# Patient Record
Sex: Female | Born: 1970 | Race: Asian | Hispanic: No | Marital: Married | Smoking: Never smoker
Health system: Southern US, Community
[De-identification: ages and names within clinical notes are randomized; demographics above are authoritative.]

---

## 1998-11-10 ENCOUNTER — Other Ambulatory Visit: Admission: RE | Admit: 1998-11-10 | Discharge: 1998-11-10 | Payer: Self-pay | Admitting: Obstetrics & Gynecology

## 1999-03-21 ENCOUNTER — Ambulatory Visit (HOSPITAL_COMMUNITY): Admission: RE | Admit: 1999-03-21 | Discharge: 1999-03-21 | Payer: Self-pay | Admitting: Obstetrics & Gynecology

## 1999-03-21 ENCOUNTER — Encounter: Payer: Self-pay | Admitting: Obstetrics & Gynecology

## 1999-08-06 HISTORY — PX: BREAST SURGERY: SHX581

## 1999-08-06 HISTORY — PX: NOSE SURGERY: SHX723

## 2011-03-18 ENCOUNTER — Ambulatory Visit: Payer: Self-pay

## 2012-10-12 ENCOUNTER — Ambulatory Visit: Payer: Self-pay | Admitting: Emergency Medicine

## 2012-10-12 LAB — CBC WITH DIFFERENTIAL/PLATELET
Basophil #: 0 10*3/uL (ref 0.0–0.1)
Basophil %: 0.8 %
Eosinophil #: 0 10*3/uL (ref 0.0–0.7)
Eosinophil %: 1.3 %
HCT: 38.7 % (ref 35.0–47.0)
HGB: 13 g/dL (ref 12.0–16.0)
Lymphocyte #: 1.6 10*3/uL (ref 1.0–3.6)
Lymphocyte %: 44.2 %
MCH: 30.3 pg (ref 26.0–34.0)
MCHC: 33.5 g/dL (ref 32.0–36.0)
MCV: 91 fL (ref 80–100)
Monocyte #: 0.2 x10 3/mm (ref 0.2–0.9)
Monocyte %: 5 %
Neutrophil #: 1.8 10*3/uL (ref 1.4–6.5)
Neutrophil %: 48.7 %
Platelet: 260 10*3/uL (ref 150–440)
RBC: 4.28 10*6/uL (ref 3.80–5.20)
RDW: 13 % (ref 11.5–14.5)
WBC: 3.7 10*3/uL (ref 3.6–11.0)

## 2012-10-12 LAB — URINALYSIS, COMPLETE
Glucose,UR: NEGATIVE mg/dL (ref 0–75)
Ketone: NEGATIVE
Nitrite: NEGATIVE
Protein: NEGATIVE

## 2012-10-12 LAB — COMPREHENSIVE METABOLIC PANEL
Albumin: 4 g/dL (ref 3.4–5.0)
Alkaline Phosphatase: 74 U/L (ref 50–136)
Anion Gap: 8 (ref 7–16)
BUN: 14 mg/dL (ref 7–18)
Bilirubin,Total: 0.4 mg/dL (ref 0.2–1.0)
Calcium, Total: 8.7 mg/dL (ref 8.5–10.1)
Chloride: 104 mmol/L (ref 98–107)
Co2: 30 mmol/L (ref 21–32)
Creatinine: 0.67 mg/dL (ref 0.60–1.30)
EGFR (African American): >60
EGFR (Non-African Amer.): >60
Glucose: 99 mg/dL (ref 65–99)
Osmolality: 284 (ref 275–301)
Potassium: 3.9 mmol/L (ref 3.5–5.1)
SGOT(AST): 19 U/L (ref 15–37)
SGPT (ALT): 28 U/L (ref 12–78)
Sodium: 142 mmol/L (ref 136–145)
Total Protein: 7.2 g/dL (ref 6.4–8.2)

## 2012-10-12 LAB — SEDIMENTATION RATE: Erythrocyte Sed Rate: 6 mm/hr (ref 0–20)

## 2012-10-12 LAB — MONONUCLEOSIS SCREEN: Mono Test: NEGATIVE

## 2012-11-30 ENCOUNTER — Ambulatory Visit: Payer: Self-pay | Admitting: Family Medicine

## 2012-12-03 ENCOUNTER — Ambulatory Visit: Payer: Self-pay | Admitting: Family Medicine

## 2013-03-24 ENCOUNTER — Ambulatory Visit: Payer: Self-pay | Admitting: Family Medicine

## 2013-06-10 ENCOUNTER — Encounter: Payer: Self-pay | Admitting: Adult Health

## 2013-06-10 ENCOUNTER — Ambulatory Visit (INDEPENDENT_AMBULATORY_CARE_PROVIDER_SITE_OTHER): Payer: BC Managed Care – PPO | Admitting: Adult Health

## 2013-06-10 VITALS — BP 98/66 | HR 66 | Temp 98.1°F | Resp 12 | Ht 63.0 in | Wt 106.0 lb

## 2013-06-10 DIAGNOSIS — Z1159 Encounter for screening for other viral diseases: Secondary | ICD-10-CM

## 2013-06-10 DIAGNOSIS — K649 Unspecified hemorrhoids: Secondary | ICD-10-CM

## 2013-06-10 DIAGNOSIS — Z139 Encounter for screening, unspecified: Secondary | ICD-10-CM | POA: Insufficient documentation

## 2013-06-10 MED ORDER — HYDROCORTISONE 2.5 % RE CREA: 1.0000 "application " | TOPICAL_CREAM | Freq: Two times a day (BID) | RECTAL | Status: DC

## 2013-06-10 NOTE — Assessment & Plan Note (Signed)
Father recently diagnosed with hepatitis B. Check hepatitis panel

## 2013-06-10 NOTE — Assessment & Plan Note (Signed)
Small protruding hemorrhoid. Anusol cream bid x 7 days.

## 2013-06-10 NOTE — [place] Notes (Signed)
Subjective:    Patient ID: Bianca Stone, female    DOB: 01-Apr-1971, 42 y.o.   MRN: [id number]  HPI  Patient is a pleasant 42 y/o female who presents to establish care. She was followed previously by Dr. Elizabeth Sauer in [place]. She reports that her father was recently diagnosed with hepatitis B. She would like to be tested for this. She also reports rectal itching and would like this evaluated as well. Otherwise, she has no concerns this visit.   History reviewed. No pertinent past medical history.   History reviewed. No pertinent past surgical history.   Family History  Problem Relation Age of Onset  . Hyperlipidemia Mother   . Hepatitis B Father      History   Social History  . Marital Status: Married    Spouse Name: N/A    Number of Children: 0  . Years of Education: N/A   Occupational History  . [employer] in [place]   Social History Main Topics  . Smoking status: Never Smoker   . Smokeless tobacco: Never Used  . Alcohol Use: No  . Drug Use: No  . Sexual Activity: Not on file   Other Topics Concern  . Not on file   Social History Narrative   Bianca Stone is from [place]. She came to the [place] in 1994. She lives at home with her husband. They do not have any children. She works at a [employer] in [employer]. She enjoys shopping, traveling and watching TV.       Review of Systems  Constitutional: Negative.   HENT: Negative.   Eyes: Negative.   Respiratory: Negative.   Cardiovascular: Negative.   Gastrointestinal: Negative.  Negative for constipation.       Hemorrhoid  Endocrine: Negative.   Genitourinary: Negative.   Musculoskeletal: Negative.   Skin: Negative.   Allergic/Immunologic: Negative.   Neurological: Negative.   Hematological: Negative.   Psychiatric/Behavioral: Negative.        Objective:   Physical Exam  Constitutional: She is oriented to person, place, and time. She appears well-developed and well-nourished. No distress.   HENT:  Head: Normocephalic and atraumatic.  Right Ear: External ear normal.  Left Ear: External ear normal.  Nose: Nose normal.  Mouth/Throat: Oropharynx is clear and moist.  Eyes: Conjunctivae and EOM are normal. Pupils are equal, round, and reactive to light.  Neck: Normal range of motion. Neck supple. No tracheal deviation present. No thyromegaly present.  Cardiovascular: Normal rate, regular rhythm, normal heart sounds and intact distal pulses.  Exam reveals no gallop and no friction rub.   No murmur heard. Pulmonary/Chest: Effort normal and breath sounds normal. No respiratory distress. She has no wheezes. She has no rales.  Abdominal: Soft. Bowel sounds are normal. She exhibits no distension and no mass. There is no tenderness. There is no rebound and no guarding.  Small protruding hemorrhoid   Musculoskeletal: Normal range of motion. She exhibits no edema and no tenderness.  Lymphadenopathy:    She has no cervical adenopathy.  Neurological: She is alert and oriented to person, place, and time. She has normal reflexes. No cranial nerve deficit. Coordination normal.  Skin: Skin is warm and dry.  Psychiatric: She has a normal mood and affect. Her behavior is normal. Judgment and thought content normal.   BP 98/66  Pulse 66  Temp(Src) 98.1 F (36.7 C) (Oral)  Resp 12  Ht 5\' 3"  (1.6 m)  Wt 106 lb (  48.081 kg)  BMI 18.78 kg/m2  SpO2 98%        Assessment & Plan:

## 2013-06-11 ENCOUNTER — Encounter: Payer: Self-pay | Admitting: *Deleted

## 2013-06-11 LAB — HEPATIC FUNCTION PANEL
ALT: 17 U/L (ref 0–35)
AST: 21 U/L (ref 0–37)
Albumin: 4.5 g/dL (ref 3.5–5.2)
Alkaline Phosphatase: 55 U/L (ref 39–117)
Bilirubin, Direct: 0.1 mg/dL (ref 0.0–0.3)
Total Bilirubin: 0.6 mg/dL (ref 0.3–1.2)
Total Protein: 7.5 g/dL (ref 6.0–8.3)

## 2013-06-11 LAB — HEPATITIS B CORE ANTIBODY, TOTAL: Hep B Core Total Ab: NONREACTIVE

## 2013-06-11 LAB — HEPATITIS C ANTIBODY: HCV Ab: NEGATIVE

## 2013-06-11 LAB — HEPATITIS B SURFACE ANTIBODY,QUALITATIVE: Hep B S Ab: NEGATIVE

## 2013-06-11 LAB — HEPATITIS B SURFACE ANTIGEN: Hepatitis B Surface Ag: NEGATIVE

## 2013-06-17 ENCOUNTER — Other Ambulatory Visit: Payer: Self-pay | Admitting: *Deleted

## 2013-06-17 ENCOUNTER — Ambulatory Visit (INDEPENDENT_AMBULATORY_CARE_PROVIDER_SITE_OTHER): Payer: BC Managed Care – PPO | Admitting: Adult Health

## 2013-06-17 ENCOUNTER — Encounter: Payer: Self-pay | Admitting: Adult Health

## 2013-06-17 ENCOUNTER — Ambulatory Visit: Payer: BC Managed Care – PPO | Admitting: Adult Health

## 2013-06-17 VITALS — BP 118/80 | HR 74 | Resp 14 | Wt 108.0 lb

## 2013-06-17 DIAGNOSIS — R1012 Left upper quadrant pain: Secondary | ICD-10-CM

## 2013-06-17 LAB — CBC WITH DIFFERENTIAL/PLATELET
Basophils Absolute: 0 10*3/uL (ref 0.0–0.1)
Basophils Relative: 0.6 % (ref 0.0–3.0)
Eosinophils Absolute: 0 10*3/uL (ref 0.0–0.7)
Eosinophils Relative: 0.8 % (ref 0.0–5.0)
HCT: 37.4 % (ref 36.0–46.0)
Hemoglobin: 12.7 g/dL (ref 12.0–15.0)
Lymphocytes Relative: 37.6 % (ref 12.0–46.0)
Lymphs Abs: 1.7 10*3/uL (ref 0.7–4.0)
MCHC: 34.1 g/dL (ref 30.0–36.0)
MCV: 90.5 fl (ref 78.0–100.0)
Monocytes Absolute: 0.2 10*3/uL (ref 0.1–1.0)
Monocytes Relative: 5.2 % (ref 3.0–12.0)
Neutro Abs: 2.5 10*3/uL (ref 1.4–7.7)
Neutrophils Relative %: 55.8 % (ref 43.0–77.0)
Platelets: 274 10*3/uL (ref 150.0–400.0)
RBC: 4.13 Mil/uL (ref 3.87–5.11)
RDW: 13.1 % (ref 11.5–14.6)
WBC: 4.6 10*3/uL (ref 4.5–10.5)

## 2013-06-17 LAB — COMPREHENSIVE METABOLIC PANEL
ALT: 18 U/L (ref 0–35)
AST: 20 U/L (ref 0–37)
Albumin: 3.9 g/dL (ref 3.5–5.2)
Alkaline Phosphatase: 53 U/L (ref 39–117)
BUN: 15 mg/dL (ref 6–23)
CO2: 29 mEq/L (ref 19–32)
Calcium: 8.6 mg/dL (ref 8.4–10.5)
Chloride: 106 mEq/L (ref 96–112)
Creatinine, Ser: 0.5 mg/dL (ref 0.4–1.2)
GFR: 149.04 mL/min (ref >60.00)
Glucose, Bld: 80 mg/dL (ref 70–99)
Potassium: 4.4 mEq/L (ref 3.5–5.1)
Sodium: 139 mEq/L (ref 135–145)
Total Bilirubin: 0.6 mg/dL (ref 0.3–1.2)
Total Protein: 6.8 g/dL (ref 6.0–8.3)

## 2013-06-17 NOTE — Telephone Encounter (Deleted)
Refill Request  Premarin 0.3 mg tab  #30  Take one by mouth every day

## 2013-06-17 NOTE — [place] Notes (Signed)
  Subjective:    Patient ID: Bianca Stone, female    DOB: [birth year], 42 y.o.   MRN: [id number]  HPI Patient is a pleasant 42 year old female who presents to clinic with complaints of left upper quadrant abdominal pain. She is having difficulty expressing herself secondary to language barrier. The patient is [place] She is trying to explain that she has had a surgery in the past. She reports surgery approximately 20 years ago. She also is able to explain that she cannot have children. She cannot specify what type of surgery she had. She reports that she had surgery on her ovaries and they have been removed, but later states that she has her ovaries. Patient is new to clinic. She reports this abdominal pain has been ongoing intermittently for approximately one year. Her previous PCP sent her for an ultrasound which she reports was normal. Records received from previous PCP does not specify surgical history. Patient is afebrile. She is able to state that her pain is improved when she eats. She denies constipation. She denies blood in her stool.   Current Outpatient Prescriptions on File Prior to Visit  Medication Sig Dispense Refill  . hydrocortisone (ANUSOL-HC) 2.5 % rectal cream Place 1 application rectally 2 (two) times daily. Apply twice daily to rectum for 7 days.  30 g  0   No current facility-administered medications on file prior to visit.    Review of Systems  Constitutional: Negative.   Gastrointestinal: Positive for abdominal pain.       LUQ and also somewhat on the lower left side  Psychiatric/Behavioral: Negative.        Objective:   Physical Exam  Constitutional: She appears well-developed and well-nourished. No distress.  Abdominal: Soft. Bowel sounds are normal. She exhibits no distension and no mass. There is tenderness. There is no rebound and no guarding.  Tenderness with palpation of left upper quadrant and down the left side of the abdomen.          Assessment &  Plan:

## 2013-06-17 NOTE — Assessment & Plan Note (Signed)
Patient is a very pleasant female with LUQ abdominal pain and also down the left side of abdomen. I am sending her for a CT of abdomen. She reports having normal ultrasound. Language barrier. She is not able to explain what type of surgery she had approximately 20 years ago.? Oophorectomy ? Hysterectomy.

## 2013-06-22 ENCOUNTER — Ambulatory Visit: Payer: Self-pay | Admitting: Adult Health

## 2013-07-08 ENCOUNTER — Encounter: Payer: Self-pay | Admitting: *Deleted

## 2013-07-08 ENCOUNTER — Other Ambulatory Visit: Payer: Self-pay | Admitting: *Deleted

## 2013-07-08 DIAGNOSIS — R1012 Left upper quadrant pain: Secondary | ICD-10-CM

## 2013-07-21 ENCOUNTER — Encounter: Payer: Self-pay | Admitting: Adult Health

## 2013-09-08 ENCOUNTER — Other Ambulatory Visit (INDEPENDENT_AMBULATORY_CARE_PROVIDER_SITE_OTHER): Payer: BC Managed Care – PPO

## 2013-09-08 DIAGNOSIS — R1012 Left upper quadrant pain: Secondary | ICD-10-CM

## 2013-09-08 LAB — URINALYSIS, ROUTINE W REFLEX MICROSCOPIC
BILIRUBIN URINE: NEGATIVE
Ketones, ur: NEGATIVE
Leukocytes, UA: NEGATIVE
Nitrite: NEGATIVE
PH: 6 (ref 5.0–8.0)
SPECIFIC GRAVITY, URINE: 1.025 (ref 1.000–1.030)
TOTAL PROTEIN, URINE-UPE24: NEGATIVE
Urine Glucose: NEGATIVE
Urobilinogen, UA: 0.2 (ref 0.0–1.0)

## 2013-09-09 ENCOUNTER — Encounter: Payer: Self-pay | Admitting: *Deleted

## 2014-07-08 ENCOUNTER — Ambulatory Visit: Payer: Self-pay | Admitting: Family Medicine

## 2014-07-08 LAB — CBC WITH DIFFERENTIAL/PLATELET
Basophil #: 0 10*3/uL (ref 0.0–0.1)
Basophil %: 0.4 %
Eosinophil #: 0 10*3/uL (ref 0.0–0.7)
Eosinophil %: 1.1 %
HCT: 40 % (ref 35.0–47.0)
HGB: 13.1 g/dL (ref 12.0–16.0)
LYMPHS PCT: 29.5 %
Lymphocyte #: 1.3 10*3/uL (ref 1.0–3.6)
MCH: 30.3 pg (ref 26.0–34.0)
MCHC: 32.8 g/dL (ref 32.0–36.0)
MCV: 92 fL (ref 80–100)
MONO ABS: 0.2 x10 3/mm (ref 0.2–0.9)
MONOS PCT: 4.1 %
NEUTROS PCT: 64.9 %
Neutrophil #: 2.9 10*3/uL (ref 1.4–6.5)
Platelet: 257 10*3/uL (ref 150–440)
RBC: 4.33 10*6/uL (ref 3.80–5.20)
RDW: 13.1 % (ref 11.5–14.5)
WBC: 4.4 10*3/uL (ref 3.6–11.0)

## 2014-07-20 IMAGING — CT CT ABD-PELV W/ CM
2 of 4 series · 17 of 46 positions shown, 19 images · IV contrast (isovue)
Comparison: Abdominal ultrasound November 30, 2012

ADDENDUM:
Comment: Uterus and ovaries appear normal in size and contour by CT.
If the patient has symptoms referable to these organs, correlation
with pelvic ultrasound may be reasonable and advisable to further
assess.
CLINICAL DATA: Abdominal pain

EXAM:
CT ABDOMEN AND PELVIS WITH CONTRAST
TECHNIQUE: Multidetector CT imaging of the abdomen and pelvis was performed
using the standard protocol following bolus administration of
intravenous contrast. Oral contrast was also administered.
CONTRAST:  100 mL Isovue 370 nonionic

[Series 2: axial soft tissue · axial · 0.60mm/px · z∈[-738,-378]mm · 14 of 80 slices shown, 16 images]
[im 4/80  soft-tissue]
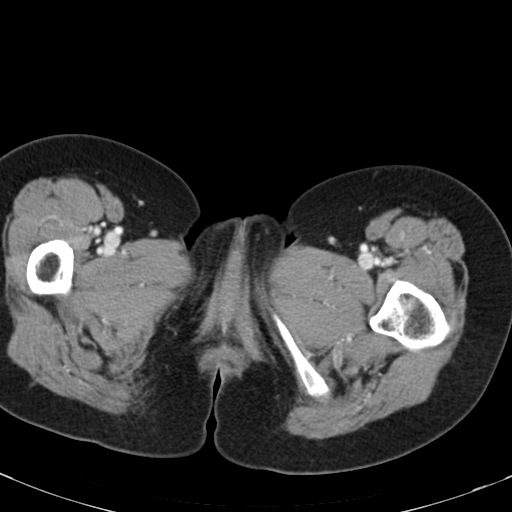
[im 4/80  bone]
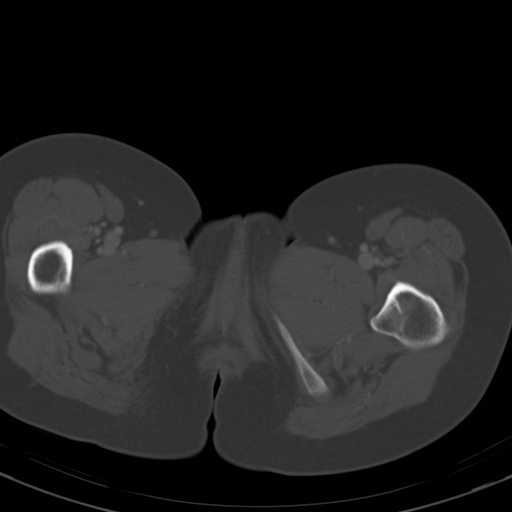
[im 10/80  soft-tissue]
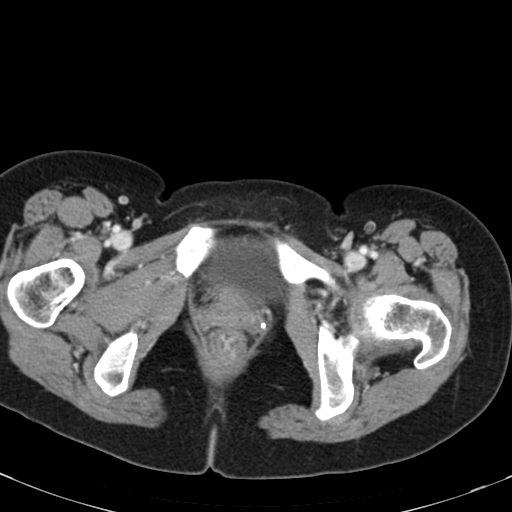
[im 17/80  soft-tissue]
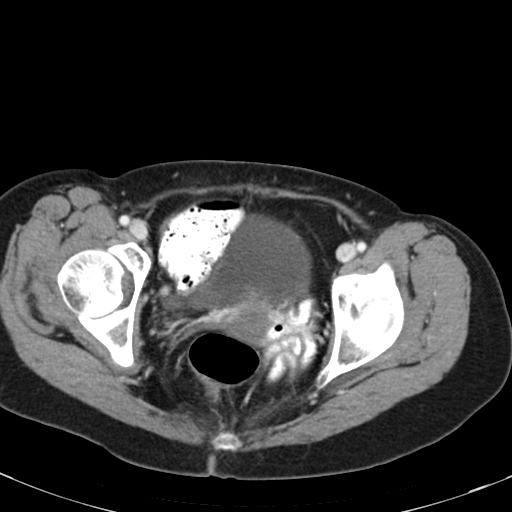
[im 20/80  soft-tissue]
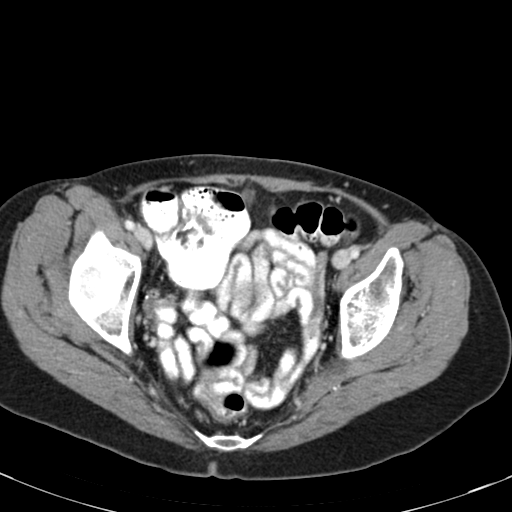
[im 27/80  soft-tissue]
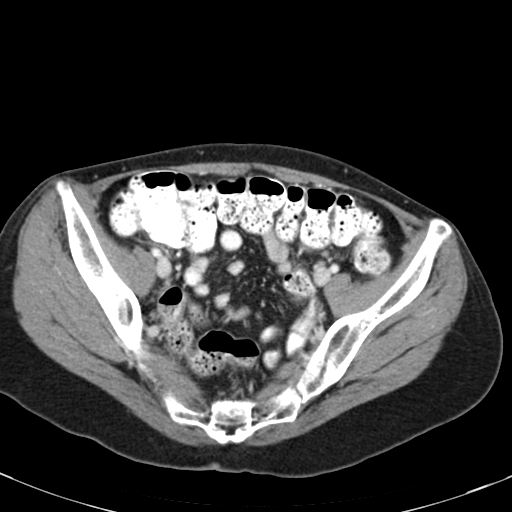
[im 33/80  soft-tissue]
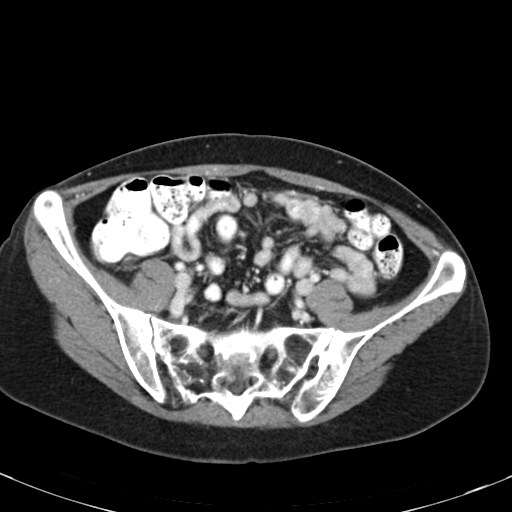
[im 37/80  soft-tissue]
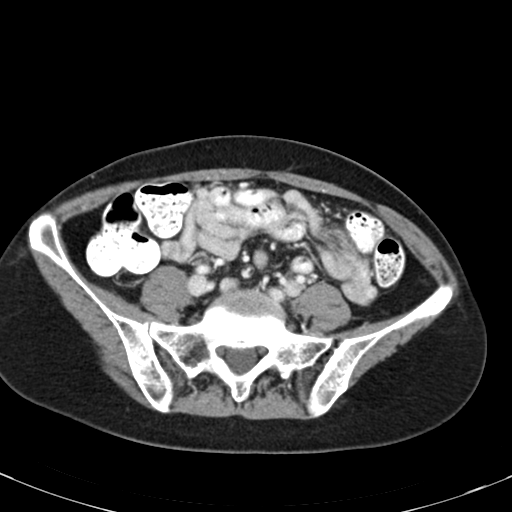
[im 43/80  soft-tissue]
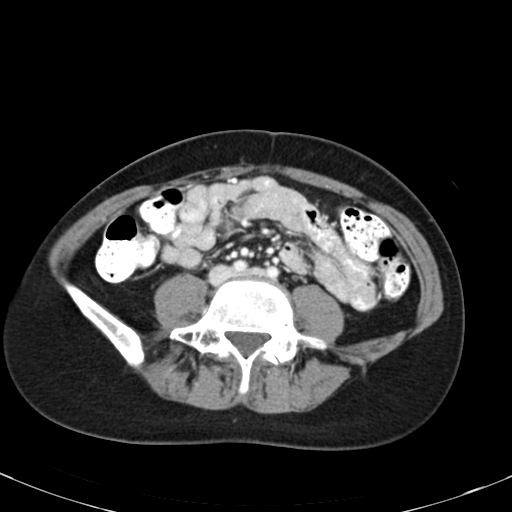
[im 47/80  soft-tissue]
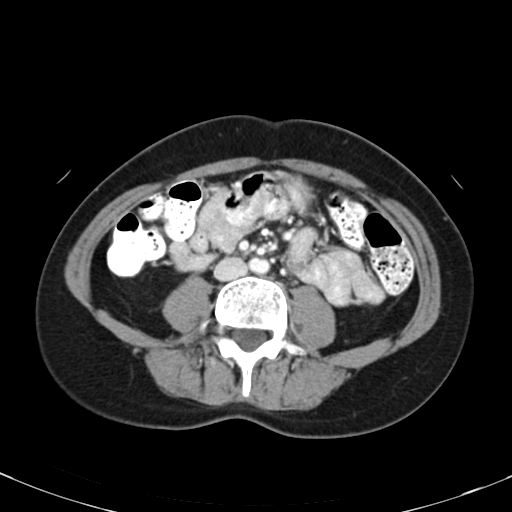
[im 47/80  bone]
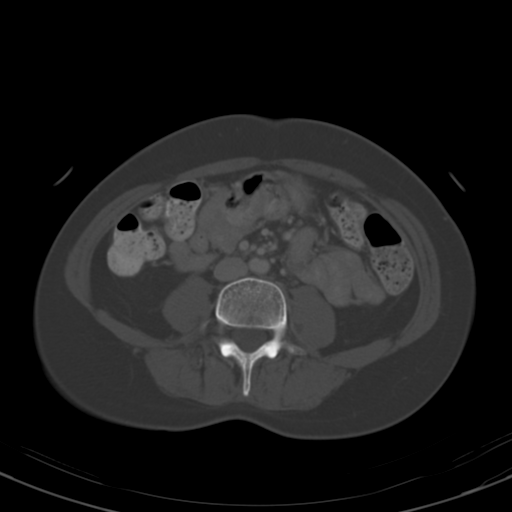
[im 53/80  soft-tissue]
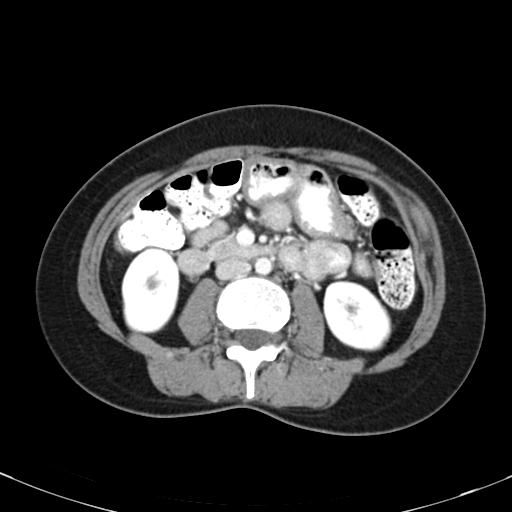
[im 60/80  soft-tissue]
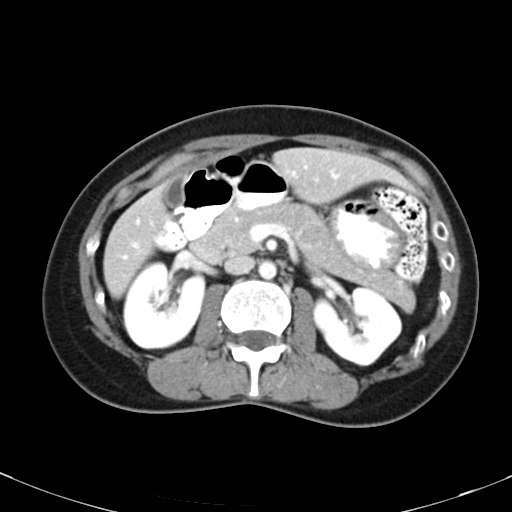
[im 63/80  soft-tissue]
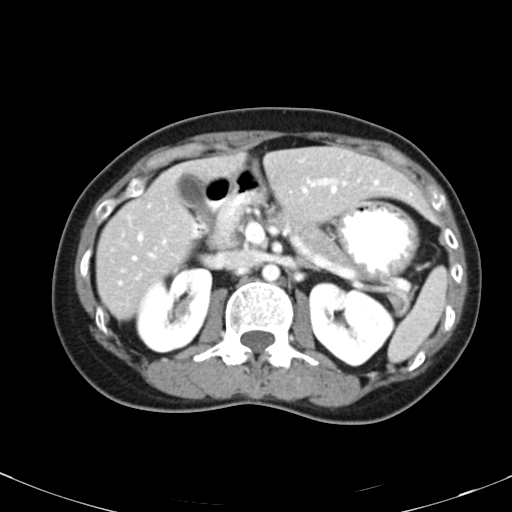
[im 70/80  soft-tissue]
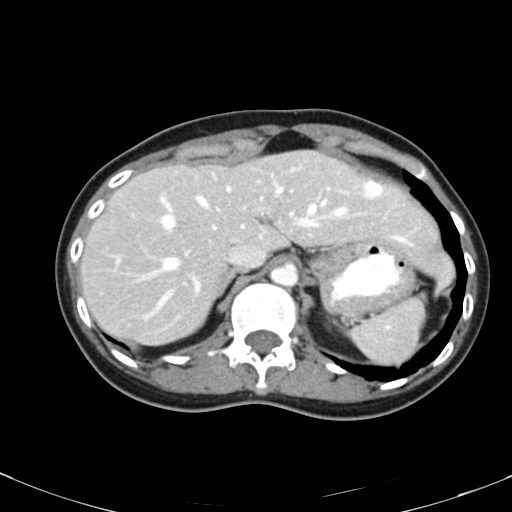
[im 76/80  soft-tissue]
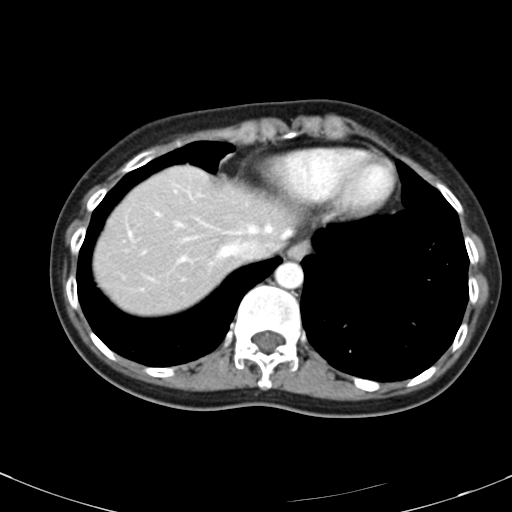

[Series 602: coronals · coronal · 0.78mm/px · 3 of 120 slices shown]
[im 40/120  soft-tissue]
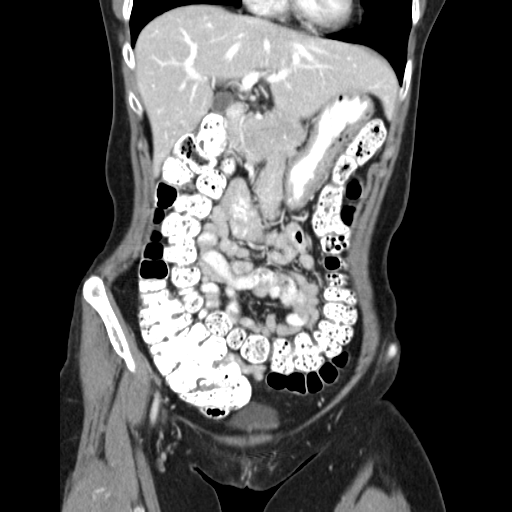
[im 53/120  soft-tissue]
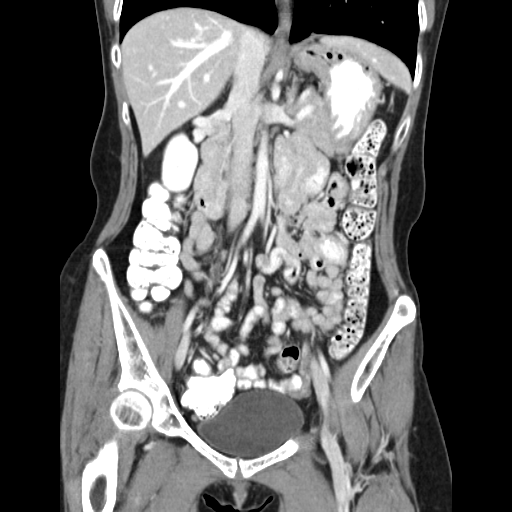
[im 67/120  soft-tissue]
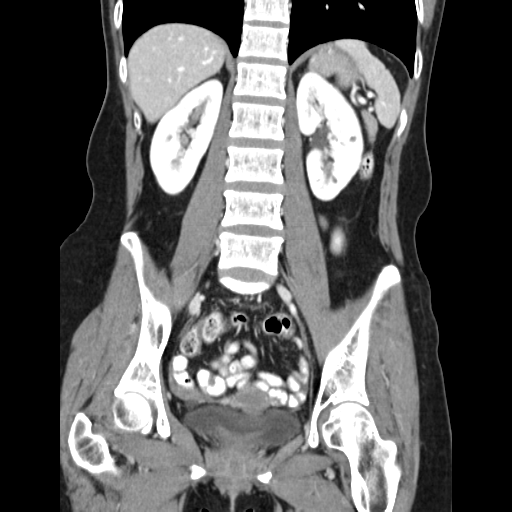

[17 of 46 positions shown; findings below may reference images not displayed]

FINDINGS: Lung bases are clear.  Breast implants are noted bilaterally.

No focal liver lesions are identified. There is no biliary duct
dilatation.

Spleen, pancreas, and adrenals appear normal.

There is a 9 x 9 mm simple cyst in the upper to mid left kidney.
There are subcentimeter simple cysts in the medial mid left kidney.
There are subcentimeter cyst in the mid to lower pole region on the
left as well. No noncystic masses are identified in either kidney.
There is no hydronephrosis or calculus in either kidney. There is no
demonstrable ureteral calculus on either side.

In the pelvis, the urinary bladder is midline with normal wall
thickness. There is no pelvic mass or fluid collection. The appendix
appears normal.

There is no bowel obstruction. There is no free air or portal venous
air. The gastric wall appears moderately thickened diffusely.

There is no apparent ascites, adenopathy, or abscess in the abdomen
or pelvis. Aorta is nonaneurysmal. There are no blastic or lytic
bone lesions.
IMPRESSION: Wall thickening in the stomach; suspect a degree of gastritis. Bowel
otherwise appears unremarkable on this study. No bowel obstruction.
No abscess. Appendix appears normal.

There are cysts in the left kidney. There is no hydronephrosis on
either side.

## 2014-09-13 ENCOUNTER — Ambulatory Visit: Payer: Self-pay | Admitting: Family Medicine

## 2015-08-06 HISTORY — PX: AUGMENTATION MAMMAPLASTY: SUR837

## 2016-04-04 ENCOUNTER — Other Ambulatory Visit: Payer: Self-pay | Admitting: Nurse Practitioner

## 2016-04-04 DIAGNOSIS — Z01419 Encounter for gynecological examination (general) (routine) without abnormal findings: Secondary | ICD-10-CM

## 2016-04-17 ENCOUNTER — Ambulatory Visit
Admission: RE | Admit: 2016-04-17 | Discharge: 2016-04-17 | Disposition: A | Discharge status: Home / Self Care | Payer: BLUE CROSS/BLUE SHIELD | Source: Ambulatory Visit | Attending: Nurse Practitioner | Admitting: Nurse Practitioner

## 2016-04-17 ENCOUNTER — Encounter: Payer: Self-pay | Admitting: Radiology

## 2016-04-17 DIAGNOSIS — Z01419 Encounter for gynecological examination (general) (routine) without abnormal findings: Secondary | ICD-10-CM

## 2016-04-17 DIAGNOSIS — Z1231 Encounter for screening mammogram for malignant neoplasm of breast: Secondary | ICD-10-CM | POA: Insufficient documentation

## 2017-04-15 ENCOUNTER — Other Ambulatory Visit: Payer: Self-pay | Admitting: Nurse Practitioner

## 2017-04-15 DIAGNOSIS — Z1231 Encounter for screening mammogram for malignant neoplasm of breast: Secondary | ICD-10-CM

## 2017-04-30 ENCOUNTER — Ambulatory Visit
Admission: RE | Admit: 2017-04-30 | Discharge: 2017-04-30 | Disposition: A | Discharge status: Home / Self Care | Payer: BLUE CROSS/BLUE SHIELD | Source: Ambulatory Visit | Attending: Nurse Practitioner | Admitting: Nurse Practitioner

## 2017-04-30 DIAGNOSIS — Z1231 Encounter for screening mammogram for malignant neoplasm of breast: Secondary | ICD-10-CM | POA: Insufficient documentation

## 2017-04-30 DIAGNOSIS — Z9882 Breast implant status: Secondary | ICD-10-CM | POA: Diagnosis not present

## 2018-02-23 ENCOUNTER — Encounter: Payer: Self-pay | Admitting: Emergency Medicine

## 2018-02-23 ENCOUNTER — Ambulatory Visit (INDEPENDENT_AMBULATORY_CARE_PROVIDER_SITE_OTHER): Payer: BLUE CROSS/BLUE SHIELD

## 2018-02-23 ENCOUNTER — Ambulatory Visit
Admission: EM | Admit: 2018-02-23 | Discharge: 2018-02-23 | Disposition: A | Discharge status: Home / Self Care | Payer: BLUE CROSS/BLUE SHIELD | Attending: Family Medicine | Admitting: Family Medicine

## 2018-02-23 ENCOUNTER — Other Ambulatory Visit: Payer: Self-pay

## 2018-02-23 DIAGNOSIS — S9031XA Contusion of right foot, initial encounter: Secondary | ICD-10-CM

## 2018-02-23 DIAGNOSIS — W208XXA Other cause of strike by thrown, projected or falling object, initial encounter: Secondary | ICD-10-CM

## 2018-02-23 DIAGNOSIS — S91311A Laceration without foreign body, right foot, initial encounter: Secondary | ICD-10-CM

## 2018-02-23 DIAGNOSIS — R2241 Localized swelling, mass and lump, right lower limb: Secondary | ICD-10-CM

## 2018-02-23 DIAGNOSIS — M79671 Pain in right foot: Secondary | ICD-10-CM

## 2018-02-23 DIAGNOSIS — Z23 Encounter for immunization: Secondary | ICD-10-CM

## 2018-02-23 MED ORDER — LIDOCAINE-EPINEPHRINE-TETRACAINE (LET) SOLUTION
3.0000 mL | Freq: Once | NASAL | Status: AC
  Administered 2018-02-23: 16:00:00 3 mL via TOPICAL

## 2018-02-23 MED ORDER — TETANUS-DIPHTH-ACELL PERTUSSIS 5-2.5-18.5 LF-MCG/0.5 IM SUSP
0.5000 mL | Freq: Once | INTRAMUSCULAR | Status: AC
  Administered 2018-02-23: 0.5 mL via INTRAMUSCULAR

## 2018-02-23 MED ORDER — MUPIROCIN 2 % EX OINT: TOPICAL_OINTMENT | CUTANEOUS | 0 refills | Status: AC

## 2018-02-23 MED ORDER — IBUPROFEN 600 MG PO TABS
600.0000 mg | ORAL_TABLET | Freq: Once | ORAL | Status: AC
  Administered 2018-02-23: 600 mg via ORAL

## 2018-02-23 NOTE — ED Triage Notes (Signed)
Patient dropped a mirror on the top of her right foot about 30 min ago. Laceration to top of right foot.

## 2018-02-23 NOTE — Discharge Instructions (Signed)
Rest. Ice. Keep clean. Use postop shoe and gradually increased activity as tolerated.   Follow up with your primary care physician this week as needed. Return to Urgent care for new or worsening concerns.

## 2018-02-23 NOTE — ED Provider Notes (Signed)
[facility] ____________________________________________  Time seen: Approximately 5:13 PM  I have reviewed the triage vital signs and the nursing notes.   HISTORY  Chief Complaint Laceration   HPI Bianca Stone is a 47 y.o. female presented for evaluation of right foot pain after injury that occurred just prior to arrival.  Patient states that she is currently moving and went back to her old house today to finish cleaning up some items, and removing a small desk a mirror and said the door fell out.  States that the mirror landed on her right top of her foot causing pain and injury.  Denies other pain or injury.  Denies head injury loss conscious.  Has continue to remain ambulatory.  Denies pain radiation, paresthesias or other complaints.  Unsure of last tetanus immunization.  States pain is mild currently, worse with direct palpation.  Denies other complaints. Denies recent sickness. Denies recent antibiotic use.   Rey, Richarda Overlieaquel M, NP: PCP No LMP recorded. Patient is postmenopausal.   History reviewed. No pertinent past medical history.  Patient Active Problem List   Diagnosis Date Noted  . Abdominal pain, left upper quadrant 06/17/2013  . Screening 06/10/2013  . Hemorrhoid 06/10/2013    Past Surgical History:  Procedure Laterality Date  . AUGMENTATION MAMMAPLASTY Bilateral 08/2015   implants replaced 9 mos ago,1st set 15 yrs ago  . BREAST SURGERY  2001   Augmentation  . NOSE SURGERY  2001   implant     No current facility-administered medications for this encounter.   Current Outpatient Medications:  .  mupirocin ointment (BACTROBAN) 2 %, Apply two times a day for 7 days., Disp: 22 g, Rfl: 0  Allergies Patient has no known allergies.  Family History  Problem Relation Age of Onset  . Hyperlipidemia Mother   . Hepatitis B Father   . Breast cancer Neg Hx     Social History Social History   Tobacco Use  . Smoking status: Never Smoker  .  Smokeless tobacco: Never Used  Substance Use Topics  . Alcohol use: No  . Drug use: No    Review of Systems Constitutional: No fever/chills Cardiovascular: Denies chest pain. Respiratory: Denies shortness of breath. Musculoskeletal: Negative for back pain. Skin: as above.  ____________________________________________   PHYSICAL EXAM:  VITAL SIGNS: ED Triage Vitals  Enc Vitals Group     BP 02/23/18 1525 108/85     Pulse Rate 02/23/18 1525 76     Resp 02/23/18 1525 18     Temp 02/23/18 1525 98.1 F (36.7 C)     Temp Source 02/23/18 1525 Oral     SpO2 02/23/18 1525 99 %     Weight 02/23/18 1523 108 lb (49 kg)     Height 02/23/18 1523 5\' 2"  (1.575 m)     Head Circumference --      Peak Flow --      Pain Score 02/23/18 1710 0     Pain Loc --      Pain Edu? --      Excl. in GC? --     Constitutional: Alert and oriented. Well appearing and in no acute distress. ENT      Head: Normocephalic and atraumatic. Cardiovascular: Normal rate, regular rhythm. Grossly normal heart sounds.  Good peripheral circulation. Respiratory: Normal respiratory effort without tachypnea nor retractions. Breath sounds are clear and equal bilaterally. No wheezes, rales, rhonchi. Musculoskeletal: Bilateral pedal pulses equal and easily palpated.Antalgic gait Neurologic:  Normal speech  and language. No gross focal neurologic deficits are appreciated. Speech is normal.  Skin:  Skin is warm, dry. Except: Right dorsal medial to midfoot area of ecchymosis and mild edema, with approximately 1.5cm laceration, appears superficial, skin well approximated, moderate tenderness to direct palpation, no drainage, no bleeding, no appearance of foreign bodies, normal distal sensation, right foot otherwise nontender.  Psychiatric: Mood and affect are normal. Speech and behavior are normal. Patient exhibits appropriate insight and judgment   ___________________________________________   LABS (all labs ordered are  listed, but only abnormal results are displayed)  Labs Reviewed - No data to display  RADIOLOGY  Dg Foot Complete Right  Result Date: 02/23/2018 CLINICAL DATA:  Pain and laceration. Mirror fell on foot. Initial encounter. EXAM: RIGHT FOOT COMPLETE - 3+ VIEW COMPARISON:  None. FINDINGS: Dorsal forefoot soft tissue swelling without fracture or opaque foreign body. IMPRESSION: Soft tissue swelling without fracture or opaque foreign body. Electronically Signed   By: Marnee Spring M.D.   On: 02/23/2018 15:40   ____________________________________________  PROCEDURES Procedures   Procedure(s) performed:  Procedure explained and verbal consent obtained. Consent: Verbal consent obtained. Written consent not obtained. Risks and benefits: risks, benefits and alternatives were discussed Patient identity confirmed: verbally with patient and hospital-assigned identification number  Consent given by: patient   Laceration Repair Location: right foot Length: 1.5 cm Foreign bodies: no foreign bodies Tendon involvement: none Nerve involvement: none Preparation: Patient was prepped and draped in the usual sterile fashion. Anesthesia with topical LET Cleaned with betadine Irrigation solution: saline Irrigation method: jet lavage Amount of cleaning: copious Repaired with x 2 steristrips and dermabond to ends of steristrips Approximation: loose Patient tolerate well. Wound well approximated post repair.  Antibiotic ointment and dressing applied.  Wound care instructions provided.  Observe for any signs of infection or other problems.      INITIAL IMPRESSION / ASSESSMENT AND PLAN / ED COURSE  Pertinent labs & imaging results that were available during my care of the patient were reviewed by me and considered in my medical decision making (see chart for details).  Well appearing patient. No acute distress. Presents for right foot pain post mechanical injury that occurred prior to arrival.  Right foot xray as above per radiologist, soft tissue swelling without fracture or radiopaque foreign body. Wound cleaned and repaired as above. Tetanus immunization updated. 600 mg single dose ibuprofen in urgent care. Will rx bactroban. Encouraged keeping clean, ice, supportive care and monitoring. Post op shoe given. Steady gait. Discussed indication, risks and benefits of medications with patient.  Discussed follow up with Primary care physician this week. Discussed follow up and return parameters including no resolution or any worsening concerns. Patient verbalized understanding and agreed to plan.   ____________________________________________   FINAL CLINICAL IMPRESSION(S) / ED DIAGNOSES  Final diagnoses:  Contusion of right foot, initial encounter  Laceration of right foot, initial encounter     ED Discharge Orders        Ordered    mupirocin ointment (BACTROBAN) 2 %     02/23/18 1702       Note: This dictation was prepared with Dragon dictation along with smaller phrase technology. Any transcriptional errors that result from this process are unintentional.         Renford Dills, NP 02/23/18 1801

## 2018-03-10 ENCOUNTER — Ambulatory Visit
Admission: EM | Admit: 2018-03-10 | Discharge: 2018-03-10 | Disposition: A | Discharge status: Home / Self Care | Payer: BLUE CROSS/BLUE SHIELD | Attending: Family Medicine | Admitting: Family Medicine

## 2018-03-10 ENCOUNTER — Other Ambulatory Visit: Payer: Self-pay

## 2018-03-10 ENCOUNTER — Encounter: Payer: Self-pay | Admitting: Emergency Medicine

## 2018-03-10 DIAGNOSIS — S9032XA Contusion of left foot, initial encounter: Secondary | ICD-10-CM

## 2018-03-10 MED ORDER — IBUPROFEN 800 MG PO TABS: 800.0000 mg | ORAL_TABLET | Freq: Three times a day (TID) | ORAL | 0 refills | Status: AC | PRN

## 2018-03-10 NOTE — ED Provider Notes (Signed)
[facility]    CSN: [id number] Arrival date & time: 03/10/18  1422  History   Chief Complaint Chief Complaint  Patient presents with  . Foot Injury    DOI 02/23/18   HPI  47 year old female presents for reevaluation after suffering a foot injury.  Patient was seen on 7/22.  She dropped a mirror on her foot.  X-ray was negative.  She was placed on Bactroban ointment.  Advised symptomatic care.  Patient reports that she continues to have swelling and some mild tenderness on the dorsum of her right foot.  Her primary concern is the swelling in the back but it is not resolved.  She is able to ambulate without difficulty.  No known exacerbating factors.  Mild in severity.  No other associated symptoms.  No other complaints.  PMH: Patient Active Problem List   Diagnosis Date Noted  . Abdominal pain, left upper quadrant 06/17/2013  . Screening 06/10/2013  . Hemorrhoid 06/10/2013   Past Surgical History:  Procedure Laterality Date  . AUGMENTATION MAMMAPLASTY Bilateral 08/2015   implants replaced 9 mos ago,1st set 15 yrs ago  . BREAST SURGERY  2001   Augmentation  . NOSE SURGERY  2001   implant   OB History   None    Home Medications    Prior to Admission medications   Medication Sig Start Date End Date Taking? Authorizing Provider  mupirocin ointment (BACTROBAN) 2 % Apply two times a day for 7 days. 02/23/18  Yes Renford DillsMiller, Lindsey, NP  ibuprofen (ADVIL,MOTRIN) 800 MG tablet Take 1 tablet (800 mg total) by mouth every 8 (eight) hours as needed. 03/10/18   Tommie Samsook, Koraline Phillipson G, DO    Family History Family History  Problem Relation Age of Onset  . Hyperlipidemia Mother   . Hepatitis B Father   . Breast cancer Neg Hx     Social History Social History   Tobacco Use  . Smoking status: Never Smoker  . Smokeless tobacco: Never Used  Substance Use Topics  . Alcohol use: No  . Drug use: No     Allergies   Patient has no known allergies.   Review of Systems Review  of Systems  Constitutional: Negative.   Musculoskeletal:       Swelling, foot.   Physical Exam Triage Vital Signs ED Triage Vitals  Enc Vitals Group     BP 03/10/18 1435 108/68     Pulse Rate 03/10/18 1435 80     Resp 03/10/18 1435 16     Temp 03/10/18 1435 98.3 F (36.8 C)     Temp Source 03/10/18 1435 Oral     SpO2 03/10/18 1435 100 %     Weight 03/10/18 1436 108 lb (49 kg)     Height 03/10/18 1436 5\' 2"  (1.575 m)     Head Circumference --      Peak Flow --      Pain Score 03/10/18 1435 0     Pain Loc --      Pain Edu? --      Excl. in GC? --    No data found.  Updated Vital Signs BP 108/68 (BP Location: Left Arm)   Pulse 80   Temp 98.3 F (36.8 C) (Oral)   Resp 16   Ht 5\' 2"  (1.575 m)   Wt 108 lb (49 kg)   SpO2 100%   BMI 19.75 kg/m   Visual Acuity Right Eye Distance:   Left Eye Distance:  Bilateral Distance:    Right Eye Near:   Left Eye Near:    Bilateral Near:     Physical Exam  Constitutional: She is oriented to person, place, and time. She appears well-developed. No distress.  HENT:  Head: Normocephalic and atraumatic.  Pulmonary/Chest: Effort normal. No respiratory distress.  Musculoskeletal:       Feet:  Right foot -patient has a raised, soft area at the labeled location.  Does not appear fluctuant but does appear soft as if there is fluid underneath.  Neurological: She is alert and oriented to person, place, and time.  Psychiatric: She has a normal mood and affect. Her behavior is normal.  Nursing note and vitals reviewed.  UC Treatments / Results  Labs (all labs ordered are listed, but only abnormal results are displayed) Labs Reviewed - No data to display  EKG None  Radiology No results found.  Procedures Procedures (including critical care time) Aspiration/Drainage  Area of the right foot was anesthetized with 1% lidocaine with epinephrine.  Area was prepped and draped in sterile fashion.  18-gauge needle was used to  aspirate.  Aspiration was not successful.  However, drainage from the needlestick allow the area to drain.  Drained blood.  Patient tolerated the procedure without difficulty.  Medications Ordered in UC Medications - No data to display  Initial Impression / Assessment and Plan / UC Course  I have reviewed the triage vital signs and the nursing notes.  Pertinent labs & imaging results that were available during my care of the patient were reviewed by me and considered in my medical decision making (see chart for details).    47 year old female presents with continued swelling of the dorsum of the foot following a recent injury.  Aspiration was performed as it was unclear whether this was secondary to hematoma.  Area drained blood.  This is consistent with hematoma.  Advised supportive care.  Swelling will improve.  Ibuprofen as directed.  Final Clinical Impressions(s) / UC Diagnoses   Final diagnoses:  Traumatic hematoma of left foot, initial encounter     Discharge Instructions     ?y l t? m?t kh?i mu t? l m?t b? s?u t?p mu. Cc s?ng s? t? t? gi?i quy?t. Khng c?n ph?i lo l?ng. S? d?ng thu?c gi?m ?au (c? sau 8 gi? khi c?n thi?t) Ti?n s? [facility]    ED Prescriptions    Medication Sig Dispense Auth. Provider   ibuprofen (ADVIL,MOTRIN) 800 MG tablet Take 1 tablet (800 mg total) by mouth every 8 (eight) hours as needed. 30 tablet Tommie Sams, DO     Controlled Substance Prescriptions [place] Controlled Substance Registry consulted? Not Applicable   Tommie Sams, DO 03/10/18 1518

## 2018-03-10 NOTE — Discharge Instructions (Signed)
?â  y l t? m?t kh?i mu t? l m?t b? s?u t?p mu. Cc s?ng s? t? t? gi?i quy?t. Khng c?n ph?i lo l?ng. S? d?ng thu?c gi?m ?au (c? sau 8 gi? khi c?n thi?t) Ti?n s? [facility] [place]

## 2018-03-10 NOTE — ED Triage Notes (Addendum)
Patient in today after dropping a mirror on her right foot on 02/23/18. Patient is concerned because the top of her foot is still swollen.

## 2018-05-12 ENCOUNTER — Other Ambulatory Visit: Payer: Self-pay | Admitting: Nurse Practitioner

## 2018-05-12 DIAGNOSIS — Z1231 Encounter for screening mammogram for malignant neoplasm of breast: Secondary | ICD-10-CM

## 2018-05-20 ENCOUNTER — Ambulatory Visit: Payer: BLUE CROSS/BLUE SHIELD

## 2018-05-25 ENCOUNTER — Ambulatory Visit
Admission: RE | Admit: 2018-05-25 | Discharge: 2018-05-25 | Disposition: A | Discharge status: Home / Self Care | Payer: BLUE CROSS/BLUE SHIELD | Source: Ambulatory Visit | Attending: Nurse Practitioner | Admitting: Nurse Practitioner

## 2018-05-25 DIAGNOSIS — Z1231 Encounter for screening mammogram for malignant neoplasm of breast: Secondary | ICD-10-CM | POA: Diagnosis not present
